# Patient Record
Sex: Female | Born: 1980 | Race: Black or African American | Hispanic: No | Marital: Single | Smoking: Current every day smoker
Health system: Southern US, Community
[De-identification: ages and names within clinical notes are randomized; demographics above are authoritative.]

---

## 2021-02-02 ENCOUNTER — Encounter (HOSPITAL_COMMUNITY): Payer: Self-pay | Admitting: *Deleted

## 2021-02-02 ENCOUNTER — Other Ambulatory Visit: Payer: Self-pay

## 2021-02-02 ENCOUNTER — Ambulatory Visit (HOSPITAL_COMMUNITY): Admission: EM | Admit: 2021-02-02 | Discharge: 2021-02-02 | Payer: Managed Care, Other (non HMO)

## 2021-02-02 ENCOUNTER — Emergency Department (HOSPITAL_COMMUNITY)
Admission: EM | Admit: 2021-02-02 | Discharge: 2021-02-02 | Disposition: A | Discharge status: Home / Self Care | Payer: Managed Care, Other (non HMO) | Attending: Emergency Medicine | Admitting: Emergency Medicine

## 2021-02-02 ENCOUNTER — Emergency Department (HOSPITAL_COMMUNITY): Payer: Managed Care, Other (non HMO)

## 2021-02-02 ENCOUNTER — Encounter (HOSPITAL_COMMUNITY): Payer: Self-pay | Admitting: Emergency Medicine

## 2021-02-02 DIAGNOSIS — J45909 Unspecified asthma, uncomplicated: Secondary | ICD-10-CM | POA: Diagnosis not present

## 2021-02-02 DIAGNOSIS — R410 Disorientation, unspecified: Secondary | ICD-10-CM | POA: Diagnosis not present

## 2021-02-02 DIAGNOSIS — W1809XA Striking against other object with subsequent fall, initial encounter: Secondary | ICD-10-CM | POA: Insufficient documentation

## 2021-02-02 DIAGNOSIS — F17210 Nicotine dependence, cigarettes, uncomplicated: Secondary | ICD-10-CM | POA: Insufficient documentation

## 2021-02-02 DIAGNOSIS — R55 Syncope and collapse: Secondary | ICD-10-CM | POA: Insufficient documentation

## 2021-02-02 DIAGNOSIS — R0981 Nasal congestion: Secondary | ICD-10-CM | POA: Diagnosis not present

## 2021-02-02 DIAGNOSIS — S0990XA Unspecified injury of head, initial encounter: Secondary | ICD-10-CM | POA: Diagnosis not present

## 2021-02-02 DIAGNOSIS — Y9289 Other specified places as the place of occurrence of the external cause: Secondary | ICD-10-CM | POA: Diagnosis not present

## 2021-02-02 LAB — I-STAT BETA HCG BLOOD, ED (MC, WL, AP ONLY): I-stat hCG, quantitative: <5 m[IU]/mL (ref <5)

## 2021-02-02 LAB — CBC
HCT: 37.8 % (ref 36.0–46.0)
Hemoglobin: 13.2 g/dL (ref 12.0–15.0)
MCH: 29.9 pg (ref 26.0–34.0)
MCHC: 34.9 g/dL (ref 30.0–36.0)
MCV: 85.5 fL (ref 80.0–100.0)
Platelets: 296 10*3/uL (ref 150–400)
RBC: 4.42 MIL/uL (ref 3.87–5.11)
RDW: 13.8 % (ref 11.5–15.5)
WBC: 7.6 10*3/uL (ref 4.0–10.5)
nRBC: 0 % (ref 0.0–0.2)

## 2021-02-02 LAB — URINALYSIS, ROUTINE W REFLEX MICROSCOPIC
Bilirubin Urine: NEGATIVE
Glucose, UA: NEGATIVE mg/dL
Ketones, ur: NEGATIVE mg/dL
Nitrite: NEGATIVE
Protein, ur: NEGATIVE mg/dL
RBC / HPF: >50 RBC/hpf — ABNORMAL HIGH (ref 0–5)
Specific Gravity, Urine: 1.008 (ref 1.005–1.030)
pH: 5 (ref 5.0–8.0)

## 2021-02-02 LAB — BASIC METABOLIC PANEL
Anion gap: 7 (ref 5–15)
BUN: 7 mg/dL (ref 6–20)
CO2: 21 mmol/L — ABNORMAL LOW (ref 22–32)
Calcium: 8.6 mg/dL — ABNORMAL LOW (ref 8.9–10.3)
Chloride: 107 mmol/L (ref 98–111)
Creatinine, Ser: 0.74 mg/dL (ref 0.44–1.00)
GFR, Estimated: >60 mL/min (ref >60)
Glucose, Bld: 99 mg/dL (ref 70–99)
Potassium: 3.8 mmol/L (ref 3.5–5.1)
Sodium: 135 mmol/L (ref 135–145)

## 2021-02-02 MED ORDER — ACETAMINOPHEN 500 MG PO TABS
1000.0000 mg | ORAL_TABLET | Freq: Once | ORAL | Status: AC
  Administered 2021-02-02: 16:00:00 1000 mg via ORAL
  Filled 2021-02-02: qty 2

## 2021-02-02 MED ORDER — KETOROLAC TROMETHAMINE 15 MG/ML IJ SOLN: 15.0000 mg | Freq: Once | INTRAMUSCULAR | Status: DC

## 2021-02-02 NOTE — ED Notes (Signed)
Pt verbalized understanding of d/c instructions, meds, and followup care. Denies questions. VSS, no distress noted. Steady gait to exit with all belongings.  ?

## 2021-02-02 NOTE — ED Notes (Addendum)
Pt was seen leaving waiting room with family member to have a smoke outside.

## 2021-02-02 NOTE — ED Provider Notes (Addendum)
Emergency Medicine Provider Triage Evaluation Note  Suzanne Brewer , a 40 y.o. female  was evaluated in triage.  Pt complains of syncope occurring earlier this morning at about 8 AM.  Patient states that she was in the kitchen, when she got suddenly hot, and woke up on the ground.  She reports 1 episode of syncope about 20 years ago, none other recently.  She said she had a left-sided headache for about the past week, and thought it is related to her sinuses.  She is complaining of pain on the back of her head, and some neck stiffness.  She is not on chronic anticoagulation.  Review of Systems  Positive: Headache, syncope, neck stiffness Negative: Fever, chills, vision changes, chest pain, dizziness  Physical Exam  BP 139/88 (BP Location: Right Arm)    Pulse 74    Temp 98.3 F (36.8 C) (Oral)    Resp 14    LMP 02/01/2021    SpO2 100%  Gen:   Awake, no distress   Resp:  Normal effort  MSK:   Moves extremities without difficulty  Other:    Medical Decision Making  Medically screening exam initiated at 10:00 AM.  Appropriate orders placed.  Suzanne Brewer was informed that the remainder of the evaluation will be completed by another provider, this initial triage assessment does not replace that evaluation, and the importance of remaining in the ED until their evaluation is complete.     Suzanne Brewer 02/02/21 1005    Ernie Avena, MD 02/02/21 1224

## 2021-02-02 NOTE — ED Provider Notes (Signed)
[facility] EMERGENCY DEPARTMENT Provider Note   CSN: [id number] Arrival date & time: 02/02/21  0347     History Chief Complaint  Patient presents with   Fall   Loss of Consciousness    Suzanne Brewer is a 40 y.o. female with past medical history significant for asthma who presents after syncopal episode.  The patient states that she was on her usual state of health this morning and was walking to kitchen when she suddenly felt and lightheaded.  The next thing she knew she woke up on the floor.  She hit her head on a concrete floor.  This event was witnessed by family medicine and he said that she was shaking but after she had the ground.  She was reportedly a little confused, but was back to her mental baseline after 1 to 2 minutes.  She denies tongue biting or incontinence.  She has no history of seizures.  She does note that she has had unilateral left-sided headaches for the past week or two.  She assumed that they were related to sinus congestion.  She has been taking ibuprofen with relief of her headaches.  She did not have a headache prior to her syncopal episode.  She also denies chest pain or shortness of breath prior to her syncopal episode.  Currently she only complains of headache.     Past Medical History:  Diagnosis Date   Asthma     There are no problems to display for this patient.   History reviewed. No pertinent surgical history.   OB History   No obstetric history on file.     History reviewed. No pertinent family history.  Social History   Tobacco Use   Smoking status: Every Day    Types: Cigarettes   Smokeless tobacco: Never    Home Medications Prior to Admission medications   Not on File    Allergies    Patient has no known allergies.  Review of Systems   Review of Systems  Constitutional:  Negative for chills and fever.  HENT:  Negative for ear pain and sore throat.   Eyes:  Negative for pain and visual disturbance.   Respiratory:  Negative for cough and shortness of breath.   Cardiovascular:  Positive for syncope. Negative for chest pain and palpitations.  Gastrointestinal:  Negative for abdominal pain and vomiting.  Genitourinary:  Negative for dysuria and hematuria.  Musculoskeletal:  Negative for arthralgias and back pain.  Skin:  Negative for color change and rash.  Neurological:  Positive for syncope, light-headedness and headaches. Negative for seizures.  All other systems reviewed and are negative.  Physical Exam Updated Vital Signs BP 128/78    Pulse 67    Temp 98.8 F (37.1 C) (Oral)    Resp 16    Ht 5\' 3"  (1.6 m)    Wt 68 kg    LMP 02/01/2021    SpO2 98%    BMI 26.57 kg/m   Physical Exam Vitals and nursing note reviewed.  Constitutional:      General: She is not in acute distress.    Appearance: She is well-developed.  HENT:     Head: Normocephalic and atraumatic.     Right Ear: External ear normal.     Left Ear: External ear normal.     Nose: Nose normal.     Mouth/Throat:     Mouth: Mucous membranes are moist.     Pharynx: Oropharynx is clear.  Eyes:  Extraocular Movements: Extraocular movements intact.     Conjunctiva/sclera: Conjunctivae normal.     Pupils: Pupils are equal, round, and reactive to light.  Neck:     Comments: Neck stiffness after fall, no midline tenderness or pain with range of motion Cardiovascular:     Rate and Rhythm: Normal rate and regular rhythm.     Pulses: Normal pulses.     Heart sounds: Normal heart sounds. No murmur heard. Pulmonary:     Effort: Pulmonary effort is normal. No respiratory distress.     Breath sounds: Normal breath sounds. No wheezing, rhonchi or rales.  Abdominal:     General: There is no distension.     Palpations: Abdomen is soft.     Tenderness: There is no abdominal tenderness. There is no guarding or rebound.  Musculoskeletal:        General: No swelling, deformity or signs of injury.     Cervical back: Full passive  range of motion without pain and neck supple. No tenderness. No pain with movement or spinous process tenderness.     Thoracic back: No tenderness.     Lumbar back: No tenderness.  Skin:    General: Skin is warm and dry.     Capillary Refill: Capillary refill takes less than 2 seconds.  Neurological:     General: No focal deficit present.     Mental Status: She is alert and oriented to person, place, and time.     GCS: GCS eye subscore is 4. GCS verbal subscore is 5. GCS motor subscore is 6.     Cranial Nerves: Cranial nerves 2-12 are intact.     Sensory: Sensation is intact.     Motor: Motor function is intact.     Gait: Gait is intact.  Psychiatric:        Mood and Affect: Mood normal.    ED Results / Procedures / Treatments   Labs (all labs ordered are listed, but only abnormal results are displayed) Labs Reviewed  BASIC METABOLIC PANEL - Abnormal; Notable for the following components:      Result Value   CO2 21 (*)    Calcium 8.6 (*)    All other components within normal limits  URINALYSIS, ROUTINE W REFLEX MICROSCOPIC - Abnormal; Notable for the following components:   APPearance HAZY (*)    Hgb urine dipstick LARGE (*)    Leukocytes,Ua TRACE (*)    RBC / HPF >50 (*)    Bacteria, UA FEW (*)    All other components within normal limits  CBC  I-STAT BETA HCG BLOOD, ED ([facility], [facility], [facility] ONLY)    EKG EKG Interpretation  Date/Time:  Friday February 02 2021 10:05:31 EST Ventricular Rate:  77 PR Interval:  116 QRS Duration: 66 QT Interval:  394 QTC Calculation: 445 R Axis:   54 Text Interpretation: Normal sinus rhythm no acute ST/T changes No old tracing to compare Confirmed by Sherwood Gambler [phone]) on 02/02/2021 2:59:33 PM  Radiology CT Head Wo Contrast  Result Date: 02/02/2021 CLINICAL DATA:  Headaches, syncope EXAM: CT HEAD WITHOUT CONTRAST TECHNIQUE: Contiguous axial images were obtained from the base of the skull through the vertex without intravenous contrast.  COMPARISON:  None. FINDINGS: Brain: No acute intracranial findings are seen. There are no signs of bleeding within the cranium. Ventricles are not dilated. There is no shift of midline structures. Vascular: Unremarkable. Skull: Unremarkable. Sinuses/Orbits: There is mild mucosal thickening in the maxillary sinuses. Other: None IMPRESSION: No  acute intracranial findings are seen in noncontrast CT brain. Electronically Signed   By: Elmer Picker M.D.   On: 02/02/2021 17:11    Procedures Procedures   Medications Ordered in ED Medications  acetaminophen (TYLENOL) tablet 1,000 mg (1,000 mg Oral Given 02/02/21 1615)    ED Course  I have reviewed the triage vital signs and the nursing notes.  Pertinent labs & imaging results that were available during my care of the patient were reviewed by me and considered in my medical decision making (see chart for details).    MDM Rules/Calculators/A&P                          Patient presents after syncopal episode described in HPI above.  EKG shows normal sinus rhythm without any acute signs of ischemia or arrhythmia.  CBC and BMP unremarkable.  Patient is very low risk based on Canadian syncope risk score and I do not believe she has an emergent etiology of her syncopal episode.  Patient endorses poor p.o. intake recently and says she was feeling dehydrated.  Dehydration and vasovagal syncope could be playing a role in patient's symptoms.  Discussed low likelihood of intracranial abnormality based on Canadian CT head rule, however using shared decision-making with the patient it was agreed to obtain the CT head.  Based on physical exam, I do not believe the patient requires any additional imaging.  Patient was given Toradol for headache.  CT head negative.  Patient is appropriate for discharge at this time.  Discharge instructions and return precautions were discussed with patient prior to discharge and included in AVS.  Patient voiced understanding of  the instructions and was amenable with the plan as described.  Patient was then discharged in stable condition.  Final Clinical Impression(s) / ED Diagnoses Final diagnoses:  Injury of head, initial encounter  Syncope and collapse    Rx / DC Orders ED Discharge Orders     None        Bentlee Benningfield, Amalia Hailey, MD 02/03/21 1031    Sherwood Gambler, MD 02/04/21 [phone]

## 2021-02-02 NOTE — ED Provider Notes (Signed)
[facility]    CSN: [id number] Arrival date & time: 02/02/21  0847      History   Chief Complaint Chief Complaint  Patient presents with   Fall   Loss of Consciousness   Headache   Torticollis    HPI Suzanne Brewer is a 40 y.o. female.   Pt reports she passed out this am. Pt states she got hot suddenly.  Pt reports no history of syncope in the past.  Pt reports she has had a headache for the past week.  Pt states she hit her head and the back of her head is sore.    The history is provided by the patient.  Fall This is a new problem. Associated symptoms include headaches. Nothing aggravates the symptoms. Nothing relieves the symptoms.  Loss of Consciousness Chronicity:  New Associated symptoms: headaches   Headache Pain location:  Generalized Onset quality:  Gradual Timing:  Constant Progression:  Worsening Worsened by:  Nothing Associated symptoms: syncope    Past Medical History:  Diagnosis Date   Asthma     There are no problems to display for this patient.   History reviewed. No pertinent surgical history.  OB History   No obstetric history on file.      Home Medications    Prior to Admission medications   Not on File    Family History History reviewed. No pertinent family history.  Social History Social History   Tobacco Use   Smoking status: Every Day    Types: Cigarettes   Smokeless tobacco: Never     Allergies   Patient has no known allergies.   Review of Systems Review of Systems  Cardiovascular:  Positive for syncope.  Neurological:  Positive for headaches.  All other systems reviewed and are negative.   Physical Exam Triage Vital Signs ED Triage Vitals  Enc Vitals Group     BP 02/02/21 0858 (!) 143/91     Pulse Rate 02/02/21 0858 81     Resp 02/02/21 0858 20     Temp 02/02/21 0858 98.3 F (36.8 C)     Temp src --      SpO2 02/02/21 0858 100 %     Weight --      Height --      Head Circumference --       Peak Flow --      Pain Score 02/02/21 0857 4     Pain Loc --      Pain Edu? --      Excl. in GC? --    No data found.  Updated Vital Signs BP (!) 143/91    Pulse 81    Temp 98.3 F (36.8 C)    Resp 20    LMP 02/01/2021    SpO2 100%   Visual Acuity Right Eye Distance:   Left Eye Distance:   Bilateral Distance:    Right Eye Near:   Left Eye Near:    Bilateral Near:     Physical Exam Vitals and nursing note reviewed.  Constitutional:      Appearance: She is well-developed.  HENT:     Head: Normocephalic.     Comments: Tender occipital scalp Cardiovascular:     Rate and Rhythm: Normal rate.  Pulmonary:     Effort: Pulmonary effort is normal.  Abdominal:     General: There is no distension.  Musculoskeletal:        General: Normal range of motion.  Cervical back: Normal range of motion.  Neurological:     Mental Status: She is alert and oriented to person, place, and time.  Psychiatric:        Mood and Affect: Mood normal.     UC Treatments / Results  Labs (all labs ordered are listed, but only abnormal results are displayed) Labs Reviewed - No data to display  EKG   Radiology No results found.  Procedures Procedures (including critical care time)  Medications Ordered in UC Medications - No data to display  Initial Impression / Assessment and Plan / UC Course  I have reviewed the triage vital signs and the nursing notes.  Pertinent labs & imaging results that were available during my care of the patient were reviewed by me and considered in my medical decision making (see chart for details).     PT needs syncope evaluation.  Pt agreeable to going to ED.  Final Clinical Impressions(s) / UC Diagnoses   Final diagnoses:  Syncope and collapse   Discharge Instructions   None    ED Prescriptions   None    PDMP not reviewed this encounter.   Elson Areas, [place] 02/02/21 [phone]

## 2021-02-02 NOTE — ED Triage Notes (Signed)
Pt reports syncope this morning, after feeling hot all of the sudden, woke up on the floor. Reports headache and knot on her head. No blood thinners. Pt awake, alert, appropriate, VSS.

## 2021-02-02 NOTE — ED Triage Notes (Signed)
Pt reports feeling hot and woke up on the floor. Pt now has a sore area on back of her head. Pt reports having a HA for past week 1/2 but thought s he had a HA. Pt A/O  x4 in triage .

## 2022-09-21 IMAGING — CT CT HEAD W/O CM
3 series · 15 of 47 positions shown, 18 images · non-contrast
Comparison: None.

CLINICAL DATA: Headaches, syncope

EXAM:
CT HEAD WITHOUT CONTRAST
TECHNIQUE: Contiguous axial images were obtained from the base of the skull
through the vertex without intravenous contrast.

[Series 3: head 5.0 h30s · axial · 0.43mm/px · z∈[-61,+74]mm · 9 of 33 slices shown, 12 images]
[im 3/33  brain]
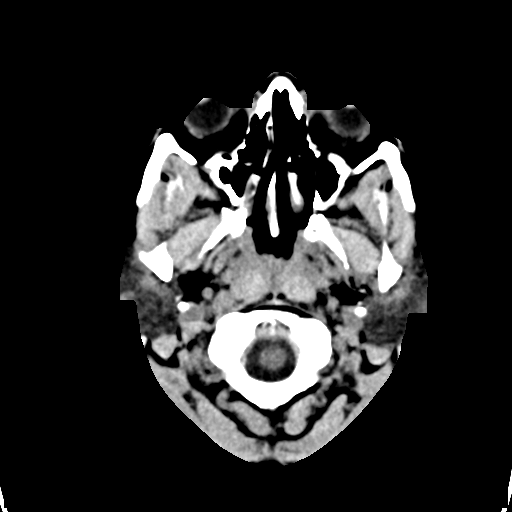
[im 3/33  bone]
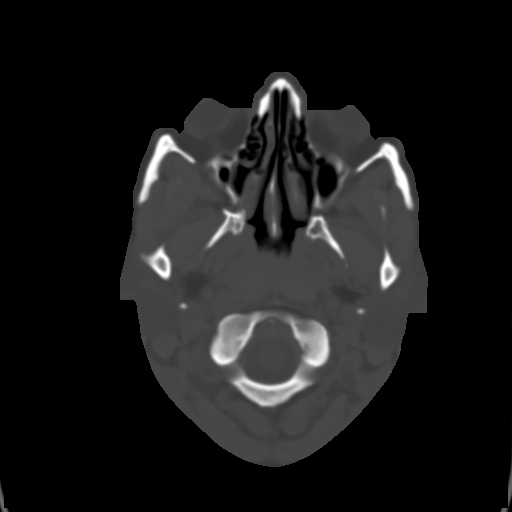
[im 6/33  brain]
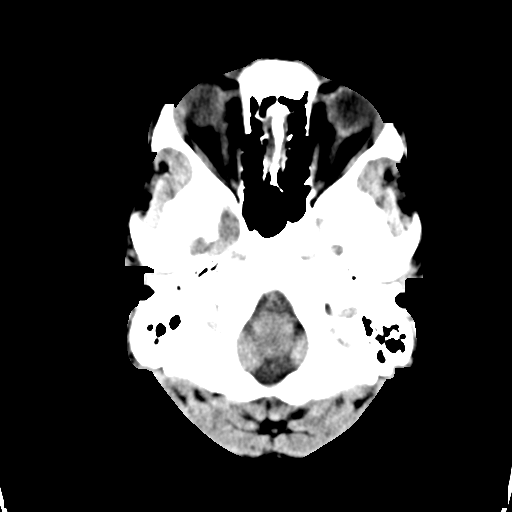
[im 9/33  brain]
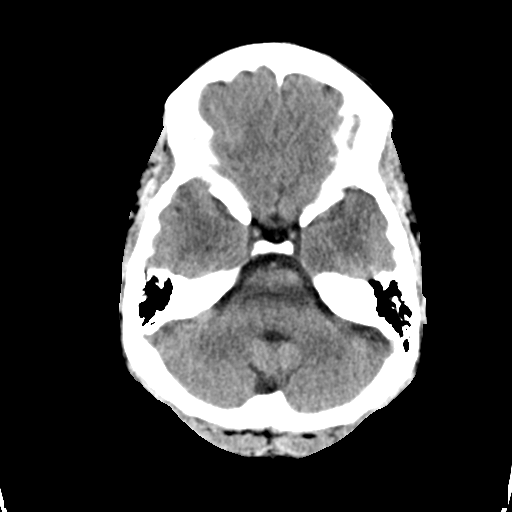
[im 13/33  brain]
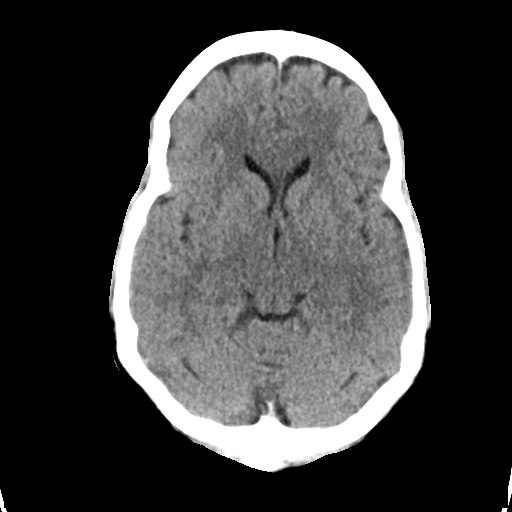
[im 17/33  brain]
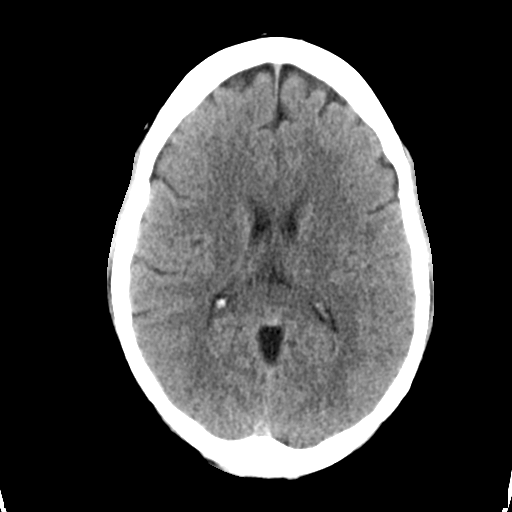
[im 17/33  bone]
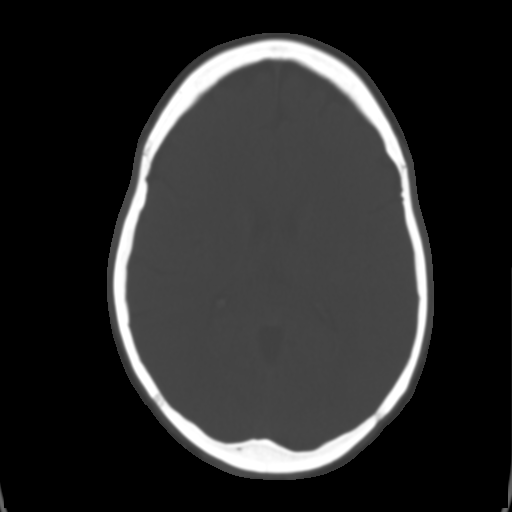
[im 20/33  brain]
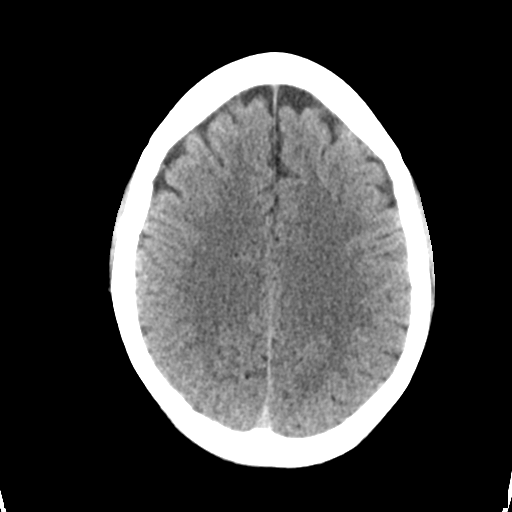
[im 24/33  brain]
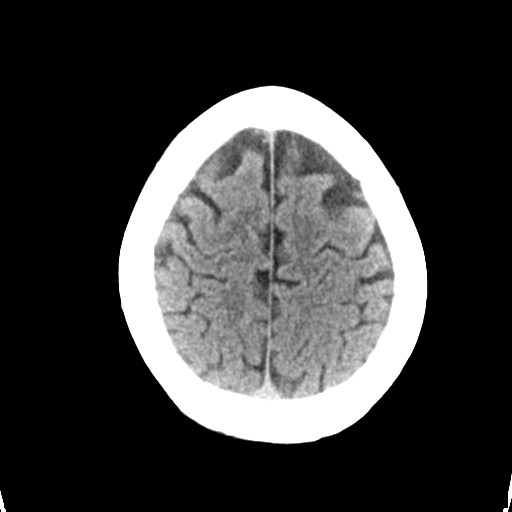
[im 27/33  brain]
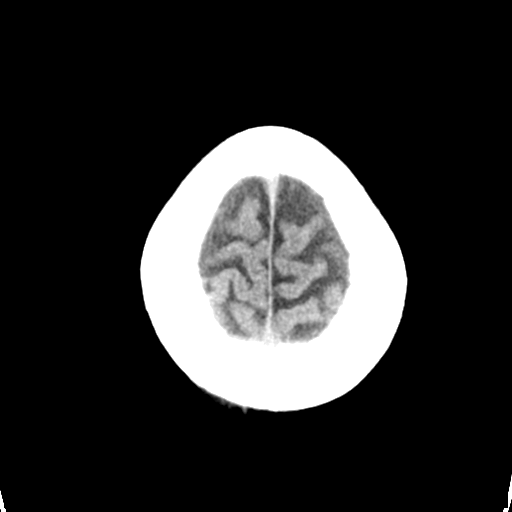
[im 30/33  brain]
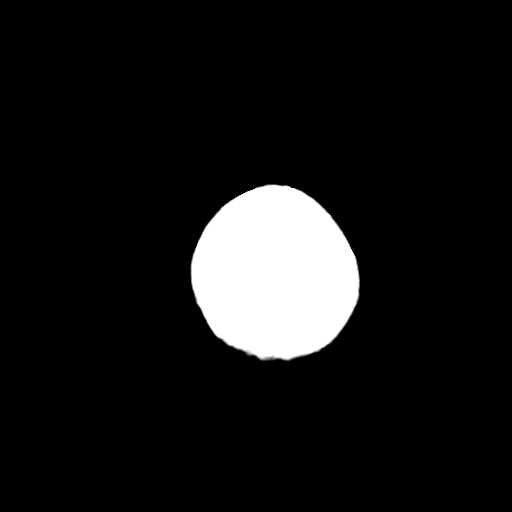
[im 30/33  bone]
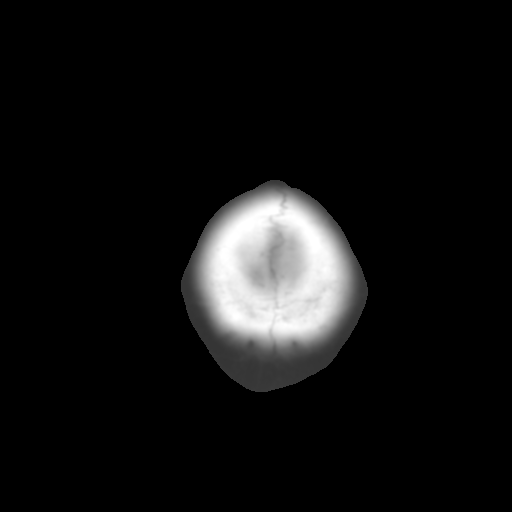

[Series 5: head 3.0 mpr cor · coronal · 0.30mm/px · 3 of 68 slices shown]
[im 23/68  brain]
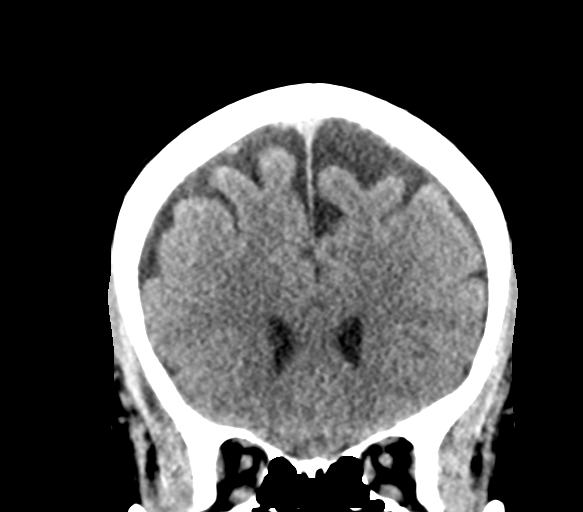
[im 30/68  brain]
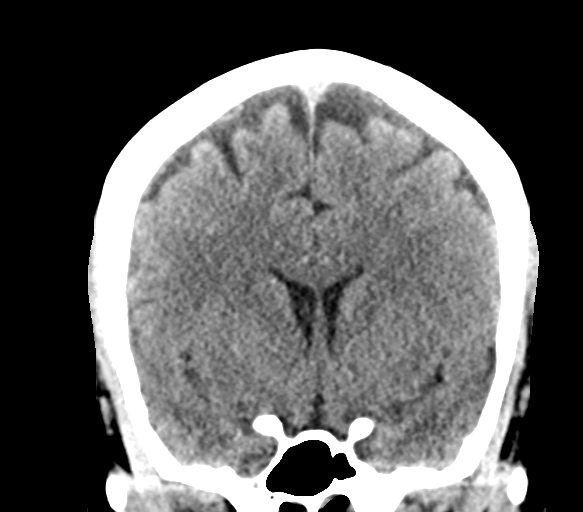
[im 38/68  brain]
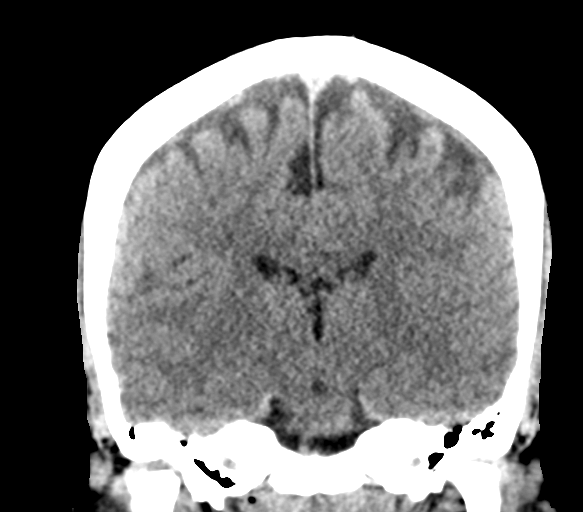

[Series 6: head 3.0 mpr sag · sagittal · 0.29mm/px · 3 of 59 slices shown]
[im 20/59  brain]
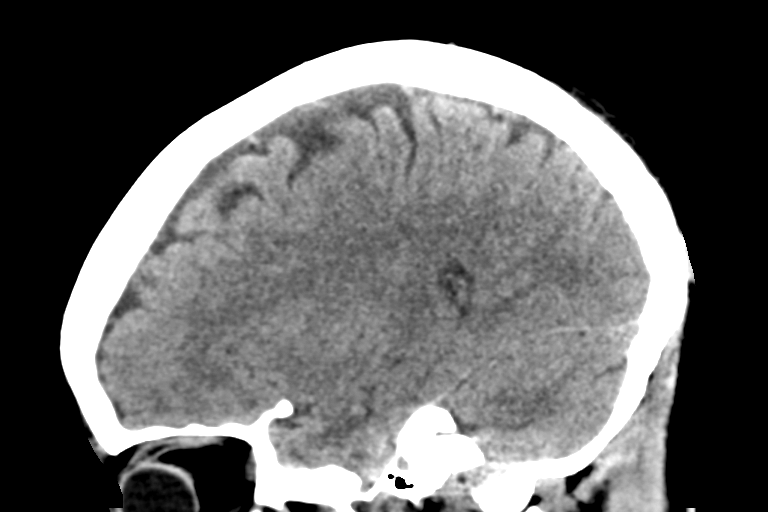
[im 30/59  brain]
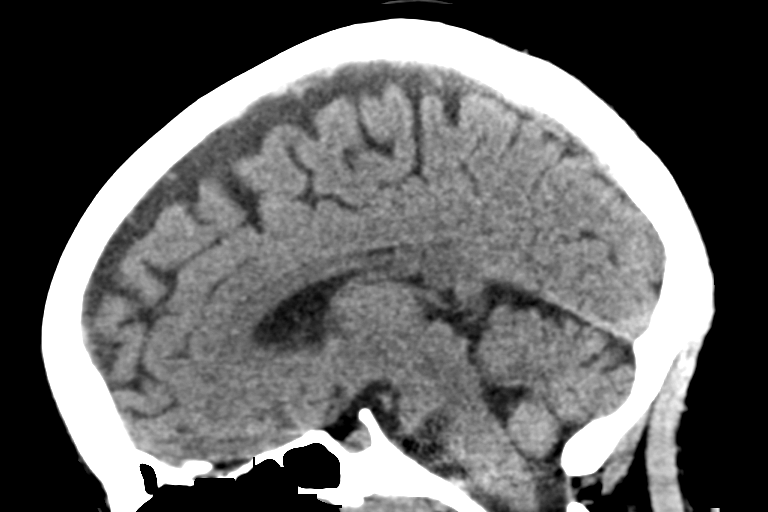
[im 39/59  brain]
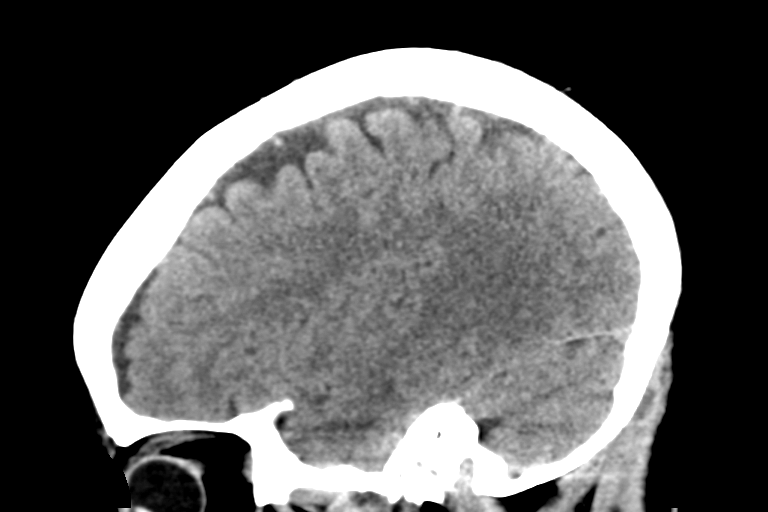

[15 of 47 positions shown; findings below may reference images not displayed]

FINDINGS: Brain: No acute intracranial findings are seen. There are no signs
of bleeding within the cranium. Ventricles are not dilated. There is
no shift of midline structures.

Vascular: Unremarkable.

Skull: Unremarkable.

Sinuses/Orbits: There is mild mucosal thickening in the maxillary
sinuses.

Other: None
IMPRESSION: No acute intracranial findings are seen in noncontrast CT brain.
